# Patient Record
Sex: Male | Born: 1940 | Race: White | Hispanic: No | Marital: Married | Smoking: Never smoker
Health system: Southern US, Community
[De-identification: ages and names within clinical notes are randomized; demographics above are authoritative.]

## PROBLEM LIST (undated history)

## (undated) DIAGNOSIS — E785 Hyperlipidemia, unspecified: Secondary | ICD-10-CM

## (undated) DIAGNOSIS — N200 Calculus of kidney: Secondary | ICD-10-CM

## (undated) DIAGNOSIS — I10 Essential (primary) hypertension: Secondary | ICD-10-CM

## (undated) DIAGNOSIS — C801 Malignant (primary) neoplasm, unspecified: Secondary | ICD-10-CM

## (undated) DIAGNOSIS — N400 Enlarged prostate without lower urinary tract symptoms: Secondary | ICD-10-CM

## (undated) DIAGNOSIS — N2889 Other specified disorders of kidney and ureter: Secondary | ICD-10-CM

---

## 1999-12-21 HISTORY — PX: ROTATOR CUFF REPAIR: SHX139

## 2000-11-07 ENCOUNTER — Ambulatory Visit (HOSPITAL_BASED_OUTPATIENT_CLINIC_OR_DEPARTMENT_OTHER): Admission: RE | Admit: 2000-11-07 | Discharge: 2000-11-07 | Payer: Self-pay | Admitting: Orthopedic Surgery

## 2000-12-20 HISTORY — PX: KIDNEY SURGERY: SHX687

## 2013-07-20 DEATH — deceased

## 2013-11-01 ENCOUNTER — Other Ambulatory Visit: Payer: Self-pay | Admitting: Urology

## 2013-11-01 DIAGNOSIS — N2889 Other specified disorders of kidney and ureter: Secondary | ICD-10-CM

## 2013-11-14 ENCOUNTER — Ambulatory Visit
Admission: RE | Admit: 2013-11-14 | Discharge: 2013-11-14 | Disposition: A | Discharge status: Home / Self Care | Payer: Medicare Other | Source: Ambulatory Visit | Attending: Urology | Admitting: Urology

## 2013-11-14 DIAGNOSIS — N2889 Other specified disorders of kidney and ureter: Secondary | ICD-10-CM

## 2013-11-14 HISTORY — DX: Hyperlipidemia, unspecified: E78.5

## 2013-11-14 HISTORY — DX: Benign prostatic hyperplasia without lower urinary tract symptoms: N40.0

## 2013-11-14 HISTORY — DX: Essential (primary) hypertension: I10

## 2013-11-14 HISTORY — DX: Other specified disorders of kidney and ureter: N28.89

## 2013-11-14 HISTORY — DX: Calculus of kidney: N20.0

## 2013-11-19 ENCOUNTER — Telehealth: Payer: Self-pay | Admitting: Emergency Medicine

## 2013-11-19 NOTE — Telephone Encounter (Signed)
CALLED WILLIAM TO SET PT UP FOR RENAL CRYOABLATION W/DR [facility]. FAXED ALL INFO TO Chrissie Noa AT [phone] AUTHO STILL PENDING. TENT. DATE 12-06-13  THEY WILL CONTACT PT.

## 2013-11-22 ENCOUNTER — Telehealth: Payer: Self-pay | Admitting: Emergency Medicine

## 2013-11-22 NOTE — Telephone Encounter (Signed)
LM TO MAKE PT AWARE THAT INS. HAS BEEN APPROVED FOR THE RENAL CRYO AT [facility]. TENT. DATE IS 12-18--14 BUT THEY WILL CALL TO CONFIRM EVERYTHING.

## 2013-12-11 ENCOUNTER — Other Ambulatory Visit (HOSPITAL_COMMUNITY): Payer: Self-pay | Admitting: Interventional Radiology

## 2013-12-11 DIAGNOSIS — C641 Malignant neoplasm of right kidney, except renal pelvis: Secondary | ICD-10-CM

## 2013-12-18 ENCOUNTER — Other Ambulatory Visit: Payer: Self-pay | Admitting: Emergency Medicine

## 2013-12-18 ENCOUNTER — Other Ambulatory Visit (HOSPITAL_COMMUNITY): Payer: Self-pay | Admitting: Interventional Radiology

## 2013-12-18 DIAGNOSIS — C641 Malignant neoplasm of right kidney, except renal pelvis: Secondary | ICD-10-CM

## 2013-12-19 ENCOUNTER — Telehealth: Payer: Self-pay | Admitting: Emergency Medicine

## 2013-12-19 NOTE — Telephone Encounter (Signed)
LM FOR PT TO CALL BACK ABOUT SKIN IRRITATION ISSUES POST RENAL CRYOABLATION   S/W DR DDH TODAY AND HE SUGGESTED ICE THE AREA FOR 15 MIN.  Q HR., TAKE TYLENOL AS NEEDED AND TOPICAL OINTMENT WOULD BE FINE FOR " SKIN BURNING SENSATION"   2PM- PT CALLED BACK AND WILL TRY DR HASSELL'S SUGGESTIONS, IF NOT BETTER BY NEXT WEEK HE WILL CONTACT us BACK.

## 2014-01-17 ENCOUNTER — Ambulatory Visit (HOSPITAL_COMMUNITY)
Admission: RE | Admit: 2014-01-17 | Discharge: 2014-01-17 | Disposition: A | Discharge status: Home / Self Care | Payer: Medicare Other | Source: Ambulatory Visit | Attending: Interventional Radiology | Admitting: Interventional Radiology

## 2014-01-17 ENCOUNTER — Ambulatory Visit
Admission: RE | Admit: 2014-01-17 | Discharge: 2014-01-17 | Disposition: A | Discharge status: Home / Self Care | Payer: Medicare Other | Source: Ambulatory Visit | Attending: Interventional Radiology | Admitting: Interventional Radiology

## 2014-01-17 ENCOUNTER — Encounter (HOSPITAL_COMMUNITY): Payer: Self-pay

## 2014-01-17 VITALS — BP 138/85 | HR 81 | Temp 98.3°F | Resp 16 | Ht 72.0 in | Wt 190.0 lb

## 2014-01-17 DIAGNOSIS — C641 Malignant neoplasm of right kidney, except renal pelvis: Secondary | ICD-10-CM

## 2014-01-17 DIAGNOSIS — Z905 Acquired absence of kidney: Secondary | ICD-10-CM | POA: Insufficient documentation

## 2014-01-17 DIAGNOSIS — N289 Disorder of kidney and ureter, unspecified: Secondary | ICD-10-CM | POA: Insufficient documentation

## 2014-01-17 DIAGNOSIS — Z85528 Personal history of other malignant neoplasm of kidney: Secondary | ICD-10-CM | POA: Insufficient documentation

## 2014-01-17 MED ORDER — IOHEXOL 300 MG/ML  SOLN
100.0000 mL | Freq: Once | INTRAMUSCULAR | Status: AC | PRN
  Administered 2014-01-17: 100 mL via INTRAVENOUS

## 2014-05-09 ENCOUNTER — Other Ambulatory Visit (HOSPITAL_COMMUNITY): Payer: Self-pay | Admitting: Interventional Radiology

## 2014-05-09 ENCOUNTER — Other Ambulatory Visit: Payer: Self-pay | Admitting: Radiology

## 2014-05-09 DIAGNOSIS — N2889 Other specified disorders of kidney and ureter: Secondary | ICD-10-CM

## 2014-05-29 ENCOUNTER — Ambulatory Visit (HOSPITAL_COMMUNITY)
Admission: RE | Admit: 2014-05-29 | Discharge: 2014-05-29 | Disposition: A | Discharge status: Home / Self Care | Payer: Medicare Other | Source: Ambulatory Visit | Attending: Interventional Radiology | Admitting: Interventional Radiology

## 2014-05-29 ENCOUNTER — Encounter (HOSPITAL_COMMUNITY): Payer: Self-pay

## 2014-05-29 ENCOUNTER — Ambulatory Visit
Admission: RE | Admit: 2014-05-29 | Discharge: 2014-05-29 | Disposition: A | Discharge status: Home / Self Care | Payer: Medicare Other | Source: Ambulatory Visit | Attending: Interventional Radiology | Admitting: Interventional Radiology

## 2014-05-29 DIAGNOSIS — N200 Calculus of kidney: Secondary | ICD-10-CM | POA: Insufficient documentation

## 2014-05-29 DIAGNOSIS — N2889 Other specified disorders of kidney and ureter: Secondary | ICD-10-CM

## 2014-05-29 DIAGNOSIS — C649 Malignant neoplasm of unspecified kidney, except renal pelvis: Secondary | ICD-10-CM | POA: Insufficient documentation

## 2014-05-29 DIAGNOSIS — N289 Disorder of kidney and ureter, unspecified: Secondary | ICD-10-CM | POA: Insufficient documentation

## 2014-05-29 HISTORY — DX: Malignant (primary) neoplasm, unspecified: C80.1

## 2014-05-29 MED ORDER — IOHEXOL 300 MG/ML  SOLN
100.0000 mL | Freq: Once | INTRAMUSCULAR | Status: AC | PRN
  Administered 2014-05-29: 100 mL via INTRAVENOUS

## 2014-05-29 NOTE — Progress Notes (Signed)
Denies hematuria or any other problems with urination.

## 2014-08-01 ENCOUNTER — Other Ambulatory Visit: Payer: Self-pay | Admitting: Radiology

## 2014-08-01 ENCOUNTER — Other Ambulatory Visit (HOSPITAL_COMMUNITY): Payer: Self-pay | Admitting: Interventional Radiology

## 2014-08-01 DIAGNOSIS — N2889 Other specified disorders of kidney and ureter: Secondary | ICD-10-CM

## 2014-08-07 ENCOUNTER — Encounter: Payer: Self-pay | Admitting: Radiology

## 2014-09-11 ENCOUNTER — Ambulatory Visit (HOSPITAL_COMMUNITY)
Admission: RE | Admit: 2014-09-11 | Discharge: 2014-09-11 | Disposition: A | Discharge status: Home / Self Care | Payer: Medicare Other | Source: Ambulatory Visit | Attending: Interventional Radiology | Admitting: Interventional Radiology

## 2014-09-11 ENCOUNTER — Ambulatory Visit
Admission: RE | Admit: 2014-09-11 | Discharge: 2014-09-11 | Disposition: A | Discharge status: Home / Self Care | Payer: Medicare Other | Source: Ambulatory Visit | Attending: Interventional Radiology | Admitting: Interventional Radiology

## 2014-09-11 ENCOUNTER — Encounter (HOSPITAL_COMMUNITY): Payer: Self-pay

## 2014-09-11 DIAGNOSIS — N2889 Other specified disorders of kidney and ureter: Secondary | ICD-10-CM

## 2014-09-11 DIAGNOSIS — N289 Disorder of kidney and ureter, unspecified: Secondary | ICD-10-CM | POA: Insufficient documentation

## 2014-09-11 MED ORDER — IOHEXOL 300 MG/ML  SOLN
100.0000 mL | Freq: Once | INTRAMUSCULAR | Status: AC | PRN
  Administered 2014-09-11: 100 mL via INTRAVENOUS

## 2014-09-11 NOTE — Progress Notes (Signed)
Patient ID: Seth Manning, male   DOB: 10/26/1941, 73 y.o.   MRN: [id number]   Chief Complaint: 9 months status post cryoablation of a right renal cell carcinoma   Referring Physician(s): Cecilie Lowers, hall,  Urology, [facility] regional physicians   History of Present Illness: Seth Manning is a 73 y.o. male who underwent successful cryoablation of a right renal cell carcinoma, original size was a 3.1 by 2.7 cm. Cryoablation was performed 12/07/2003 at [facility]. He continues to do very well. No current abdominal pain, flank pain, dysuria, or hematuria. No fevers. Residual paresthesias and skin sensitivity in the right flank has resolved. Right shoulder pain continues to improve. No significant physical limitation. He remains very active and doing very well.  Past Medical History  Diagnosis Date  . Right renal mass   . Hypertension   . BPH (benign prostatic hypertrophy)   . Kidney calculus   . Hyperlipidemia   . Cancer     renal ca dx'd 2002/ rt. recurrence 09/2013    Past Surgical History  Procedure Laterality Date  . Rotator cuff repair Right 2001  . Kidney surgery Right 2002    Allergies: Review of patient's allergies indicates no known allergies.  Medications: Prior to Admission medications   Not on File    No family history on file.  History   Social History  . Marital Status: Married    Spouse Name: N/A    Number of Children: N/A  . Years of Education: N/A   Social History Main Topics  . Smoking status: Never Smoker   . Smokeless tobacco: Never Used  . Alcohol Use: No  . Drug Use: No  . Sexual Activity: Not on file   Other Topics Concern  . Not on file   Social History Narrative  . No narrative on file    Review of Systems: A 12 point ROS discussed and pertinent positives are indicated in the HPI above.  All other systems are negative.  Review of Systems  Constitutional: Negative for chills, diaphoresis, activity change and appetite change.    Respiratory: Negative for cough and chest tightness.   Cardiovascular: Negative for chest pain and palpitations.  Gastrointestinal: Negative for abdominal distention.  Genitourinary: Negative for dysuria, frequency, hematuria, flank pain and difficulty urinating.    Vital Signs: BP 147/79  Pulse 57  Temp(Src) 97.6 F (36.4 C) (Oral)  Resp 14  SpO2 96%  Physical Exam  Constitutional: He appears well-developed and well-nourished. No distress.  Abdominal: Soft. He exhibits no distension.  Negative for flank pain or CVA tenderness.  Skin: He is not diaphoretic.    Imaging: Ct Abd Wo & W Cm  09/11/2014   CLINICAL DATA:  Status post cryo ablation and partial right nephrectomy for renal cell carcinoma.  EXAM: CT ABDOMEN WITHOUT AND WITH CONTRAST  TECHNIQUE: Multidetector CT imaging of the abdomen was performed following the standard protocol before and following the bolus administration of intravenous contrast.  CONTRAST:  140mL OMNIPAQUE IOHEXOL 300 MG/ML  SOLN  COMPARISON:  05/29/2014  FINDINGS: Lower chest: The lung bases are clear. No pleural or pericardial effusion.  Hepatobiliary: No suspicious liver lesion identified. Collapsed gallbladder no biliary dilatation.  Spleen: Appears normal.  Pancreas: Appears normal.  Stomach/Bowel: The stomach is on unremarkable. The visualized portions of the small bowel appear normal. Multiple colonic diverticula identified.  Adrenals/urinary tract: Normal appearance of both adrenal glands. Cyst within the left kidney measures 2.1 x 1.4 cm, image 61/series 4. Postoperative  changes from inferior pole right nephrectomy and upper pole right cryoablation identified. Stable appearance of the cryo ablation zone measuring approximately 2 x 1.1 cm, image 50/series 4. The focus of enhancement ventral to the cryoablation zone is again noted. This measures 1.4 x 1.2 cm, image 50/series 2. Previously 1.5 x 1.2 cm. As mentioned previously there is an adjacent small artery  and this may represent a pseudo aneurysm. No additional enhancing renal lesions identified. Tiny stone is noted within the inferior pole of the left kidney, image 69/series 2.  Vascular/Lymphatic: Normal caliber of the abdominal aorta. No aneurysm. There is mild calcified atherosclerotic change noted. No enlarged retroperitoneal lymph nodes. No mesenteric adenopathy.  Musculoskeletal: No aggressive lytic or sclerotic bone lesions identified.  Other: None  IMPRESSION: 1. No acute findings within the abdomen or pelvis. 2. Stable cryo ablation zone involving the posterior medial aspect of the upper pole of right kidney. 3. Stable area of arterial in phase enhancement ventral to the cryo ablation zone which may reflect avascular lesion. 4. No evidence of metastatic disease or new suspicious renal lesion.   Electronically Signed   By: Kerby Moors M.D.   On: 09/11/2014 11:03    Labs: Outside labs demonstrate a stable creatinine measuring 1.33. GFR 53.   Assessment and Plan: status post cryoablation of a 3.1 cm right upper pole renal cell carcinoma. Surveillance CT imaging confirms a stable 14 x 12 mm hyperenhancing focus along the medial cryoablation zone border compatible with residual tumor. No significant change over 3 months. We had a lengthy discussion today about proceeding with repeat cryoablation and also protecting the upper pole collecting system with a ureteral stent prior to a second procedure. Another option discussed was to continue close CT surveillance. At this time, because he is asymptomatic, he would like to continue with close CT surveillance. Repeat multiphase abdomen CT will be performed at 6 months.  The patient and his wife are in agreement with this plan.  **   I spent a total of 30 minutes face to face in clinical consultation, greater than 50% of which was counseling/coordinating care for his right renal cell carcinoma  Signed: Briley Bumgarner T. 09/11/2014, 2:01 PM

## 2015-02-04 ENCOUNTER — Telehealth: Payer: Self-pay | Admitting: Radiology

## 2015-02-04 NOTE — Telephone Encounter (Signed)
Recent CT Abd w/ & w/o contrast of 12/30/2014 (ordered by Dr Jonette Eva & performed at [facility], [place]).  Images & results reviewed by Dr Daryll Brod.  Patient phoned.  Our office does not need to repeat CT at this time.  Patient stated that Dr [place] Crane to order next CT follow up in December 2016.   Will check w/ Dr Annamaria Boots re:  ? Additional follow up here.  Natalynn Pedone Riki Rusk, RN 02/04/2015 12:29 PM

## 2015-11-11 ENCOUNTER — Other Ambulatory Visit (HOSPITAL_COMMUNITY): Payer: Self-pay | Admitting: Interventional Radiology

## 2015-11-11 DIAGNOSIS — C641 Malignant neoplasm of right kidney, except renal pelvis: Secondary | ICD-10-CM

## 2015-11-12 ENCOUNTER — Encounter: Payer: Self-pay | Admitting: Radiology

## 2015-11-12 ENCOUNTER — Other Ambulatory Visit: Payer: Self-pay | Admitting: Radiology

## 2015-11-12 DIAGNOSIS — C641 Malignant neoplasm of right kidney, except renal pelvis: Secondary | ICD-10-CM

## 2015-12-03 ENCOUNTER — Ambulatory Visit
Admission: RE | Admit: 2015-12-03 | Discharge: 2015-12-03 | Disposition: A | Discharge status: Home / Self Care | Payer: Medicare Other | Source: Ambulatory Visit | Attending: Interventional Radiology | Admitting: Interventional Radiology

## 2015-12-03 ENCOUNTER — Ambulatory Visit (HOSPITAL_COMMUNITY)
Admission: RE | Admit: 2015-12-03 | Discharge: 2015-12-03 | Disposition: A | Discharge status: Home / Self Care | Payer: Medicare Other | Source: Ambulatory Visit | Attending: Interventional Radiology | Admitting: Interventional Radiology

## 2015-12-03 DIAGNOSIS — Z85528 Personal history of other malignant neoplasm of kidney: Secondary | ICD-10-CM | POA: Diagnosis present

## 2015-12-03 DIAGNOSIS — C641 Malignant neoplasm of right kidney, except renal pelvis: Secondary | ICD-10-CM

## 2015-12-03 DIAGNOSIS — Z9049 Acquired absence of other specified parts of digestive tract: Secondary | ICD-10-CM | POA: Diagnosis not present

## 2015-12-03 DIAGNOSIS — K449 Diaphragmatic hernia without obstruction or gangrene: Secondary | ICD-10-CM | POA: Diagnosis not present

## 2015-12-03 DIAGNOSIS — Z905 Acquired absence of kidney: Secondary | ICD-10-CM | POA: Insufficient documentation

## 2015-12-03 DIAGNOSIS — I2510 Atherosclerotic heart disease of native coronary artery without angina pectoris: Secondary | ICD-10-CM | POA: Diagnosis not present

## 2015-12-03 DIAGNOSIS — C649 Malignant neoplasm of unspecified kidney, except renal pelvis: Secondary | ICD-10-CM | POA: Insufficient documentation

## 2015-12-03 MED ORDER — IOHEXOL 300 MG/ML  SOLN
100.0000 mL | Freq: Once | INTRAMUSCULAR | Status: AC | PRN
  Administered 2015-12-03: 100 mL via INTRAVENOUS

## 2015-12-03 NOTE — Progress Notes (Signed)
Patient ID: Seth Manning, male   DOB: [birth year], 74 y.o.   MRN: [id number]       Chief Complaint: Patient was seen in consultation today for  Chief Complaint  Patient presents with  . Follow-up    2 yr follow up Cryoablation of RIght Renal Mass     at the request of Esraa Seres  Referring Physician(s): Nevada Crane  History of Present Illness: Seth Manning is a 74 y.o. male who underwent successful cryoablation of a right renal cell carcinoma December 2014. Original lesion size was 3.1 x 2.7 cm. He continues to do very well. No current abdominal pain, flank pain, dysuria or hematuria. No fevers. Right flank paresthesias have resolved. No current physical limitations. Functional status is excellent. He returns for outpatient follow-up at 6 months with a repeat surveillance CT.  Past Medical History  Diagnosis Date  . Right renal mass   . Hypertension   . BPH (benign prostatic hypertrophy)   . Kidney calculus   . Hyperlipidemia   . Cancer     renal ca dx'd 2002/ rt. recurrence 09/2013    Past Surgical History  Procedure Laterality Date  . Rotator cuff repair Right 2001  . Kidney surgery Right 2002    Allergies: Review of patient's allergies indicates no known allergies.  Medications: Prior to Admission medications   Medication Sig Start Date End Date Taking? Authorizing Provider  atenolol (TENORMIN) 25 MG tablet Take 12.5 mg by mouth daily.   Yes Historical Provider, MD  finasteride (PROSCAR) 5 MG tablet Take 5 mg by mouth daily.   Yes Historical Provider, MD  sertraline (ZOLOFT) 50 MG tablet Take 50 mg by mouth daily.   Yes Historical Provider, MD  simvastatin (ZOCOR) 40 MG tablet Take 40 mg by mouth daily.   Yes Historical Provider, MD     No family history on file.  Social History   Social History  . Marital Status: Married    Spouse Name: N/A  . Number of Children: N/A  . Years of Education: N/A   Social History Main Topics  . Smoking status: Never Smoker   .  Smokeless tobacco: Never Used  . Alcohol Use: No  . Drug Use: No  . Sexual Activity: Not on file   Other Topics Concern  . Not on file   Social History Narrative  . No narrative on file     Review of Systems: A 12 point ROS discussed and pertinent positives are indicated in the HPI above.  All other systems are negative.  Review of Systems  Vital Signs: BP 120/77 mmHg  Pulse 75  Temp(Src) 97.5 F (36.4 C) (Oral)  Resp 14  Ht 6' (1.829 m)  Wt 185 lb (83.915 kg)  BMI 25.08 kg/m2  SpO2 98%  Physical Exam  Constitutional: He appears well-developed and well-nourished. No distress.  Neck: Neck supple.  No carotid bruit.  Cardiovascular: Normal rate and regular rhythm.  Exam reveals no friction rub.   No murmur heard. Pulmonary/Chest: Effort normal and breath sounds normal. No respiratory distress. He has no wheezes.  Abdominal: Soft. Bowel sounds are normal. He exhibits no distension and no mass. There is guarding. No hernia.  Skin: Skin is warm and dry. No rash noted. He is not diaphoretic. No erythema.  Psychiatric: He has a normal mood and affect. His behavior is normal. Thought content normal.     Imaging: Ct Abd Wo & W Cm  12/03/2015  CLINICAL DATA:  Two years post right  renal cryoablation, previous right partial nephrectomy. Right renal cell carcinoma. EXAM: CT ABDOMEN WITHOUT AND WITH CONTRAST TECHNIQUE: Multidetector CT imaging of the abdomen was performed following the standard protocol before and following the bolus administration of intravenous contrast. CONTRAST:  170mL OMNIPAQUE IOHEXOL 300 MG/ML  SOLN COMPARISON:  12/30/2014. FINDINGS: Lower chest: 4 mm medial left lower lobe nodule (image 6), unchanged from 10/31/2013. Lung bases are otherwise clear. Heart size normal. No pericardial or pleural effusion. Hepatobiliary: Liver is unremarkable. Cholecystectomy. No biliary ductal dilatation. Pancreas: Negative. Spleen: Negative. Adrenals/Urinary Tract: Adrenal glands  are unremarkable. There may be punctate renal stones bilaterally. Cryoablation defect is seen along the medial aspect of the upper pole right kidney. Rounded enhancement along the medial portion of the ablation defect is again seen, measuring 1.2 x 1.4 cm, stable to minimally more prominent than on 12/30/2014, at which time it measured approximately 1.2 x 1.2 cm when remeasured. Scarring along the lower pole right kidney. Low-attenuation lesions in the left kidney measure up to 1.8 cm, consistent with cysts. Stomach/Bowel: Tiny hiatal hernia. Stomach is otherwise unremarkable. Duodenal diverticulum is incidentally noted. Visualized portions of the small bowel and colon are otherwise unremarkable. Vascular/Lymphatic: Atherosclerotic calcification of the arterial vasculature without abdominal aortic aneurysm. No pathologically enlarged lymph nodes. Other: No free fluid.  Mesenteries and peritoneum are unremarkable. Musculoskeletal: No worrisome lytic or sclerotic lesions. Degenerative changes are seen in the spine. IMPRESSION: 1. Cryoablation defect in the upper pole right kidney with a small area of nodular enhancement along the medial margin, stable to minimally enlarged from 12/30/2014. 2. Question punctate stones in the kidneys bilaterally. Electronically Signed   By: Lorin Picket M.D.   On: 12/03/2015 12:25    Labs:  Creatinine 1.4 11/28/2015 (previous creatinine 1 year ago 1.33)  Assessment and Plan:  2 years status post right renal cell carcinoma cryoablation. Original lesion measured 3.1 cm in diameter in the right kidney upper pole. Surveillance CT imaging confirms a persistent stable 14 x 12 mm enhancing focus along the medial ablation zone compatible with residual tumor. No interval change dating back to September 2015. We had a lengthy discussion regarding repeat cryoablation versus continued close CT surveillance. At this time given that he continues to be asymptomatic he would like to  continue with 6 month close CT surveillance. He is a very compliant patient, and if there is any significant change on the next CT he will consider repeat cryoablation.  Plan: Repeat multiphase abdomen CT in 6 months with outpatient follow-up.  Thank you for this interesting consult.  I greatly enjoyed meeting Seth Manning and look forward to participating in their care.  A copy of this report was sent to the requesting provider on this date.  SignedGreggory Keen 12/03/2015, 2:18 PM   I spent a total of    25 Minutes in face to face in clinical consultation, greater than 50% of which was counseling/coordinating care for this patient with a right renal cell carcinoma, status post cryoablation.

## 2016-05-04 ENCOUNTER — Other Ambulatory Visit (HOSPITAL_COMMUNITY): Payer: Self-pay | Admitting: Interventional Radiology

## 2016-05-04 ENCOUNTER — Other Ambulatory Visit: Payer: Self-pay | Admitting: Radiology

## 2016-05-04 DIAGNOSIS — C641 Malignant neoplasm of right kidney, except renal pelvis: Secondary | ICD-10-CM

## 2016-05-18 ENCOUNTER — Encounter: Payer: Self-pay | Admitting: Radiology

## 2016-06-10 ENCOUNTER — Ambulatory Visit
Admission: RE | Admit: 2016-06-10 | Discharge: 2016-06-10 | Disposition: A | Discharge status: Home / Self Care | Payer: Medicare Other | Source: Ambulatory Visit | Attending: Interventional Radiology | Admitting: Interventional Radiology

## 2016-06-10 DIAGNOSIS — C641 Malignant neoplasm of right kidney, except renal pelvis: Secondary | ICD-10-CM

## 2016-06-10 NOTE — Progress Notes (Signed)
Patient ID: Seth Manning, male   DOB: Apr 24, 1941, 75 y.o.   MRN: [id number]    Chief Complaint: Right renal cell carcinoma, localized recurrence at the ablation defect. 2.5 year follow-up. Referring Physician(s): hall  History of Present Illness: Seth Manning is a 75 y.o. male with a known right renal cell carcinoma. He underwent successful cryoablation December 2014. Original lesion size 3.1 x 2.7 cm. Close surveillance imaging demonstrates a localized recurrence along the medial ablation zone, slightly enlarged compared to 6 months ago measuring 17 x 15 mm. He remains asymptomatically. No current abdominal pain, flank pain, dysuria or hematuria. No fevers. No current physical limitations. Excellent functional status. He returns for outpatient follow-up and review of his surveillance imaging.  Past Medical History  Diagnosis Date  . Right renal mass   . Hypertension   . BPH (benign prostatic hypertrophy)   . Kidney calculus   . Hyperlipidemia   . Cancer     renal ca dx'd 2002/ rt. recurrence 09/2013    Past Surgical History  Procedure Laterality Date  . Rotator cuff repair Right 2001  . Kidney surgery Right 2002    Allergies: Review of patient's allergies indicates no known allergies.  Medications: Prior to Admission medications   Medication Sig Start Date End Date Taking? Authorizing Provider  atenolol (TENORMIN) 25 MG tablet Take 12.5 mg by mouth daily.   Yes Historical Provider, MD  finasteride (PROSCAR) 5 MG tablet Take 5 mg by mouth daily.   Yes Historical Provider, MD  sertraline (ZOLOFT) 50 MG tablet Take 50 mg by mouth daily.   Yes Historical Provider, MD  simvastatin (ZOCOR) 40 MG tablet Take 40 mg by mouth daily.   Yes Historical Provider, MD     No family history on file.  Social History   Social History  . Marital Status: Married    Spouse Name: N/A  . Number of Children: N/A  . Years of Education: N/A   Social History Main Topics  . Smoking status:  Never Smoker   . Smokeless tobacco: Never Used  . Alcohol Use: No  . Drug Use: No  . Sexual Activity: Not on file   Other Topics Concern  . Not on file   Social History Narrative  . No narrative on file    ECOG Status: 0 - Asymptomatic  Review of Systems: A 12 point ROS discussed and pertinent positives are indicated in the HPI above.  All other systems are negative.  Review of Systems  Vital Signs: BP 132/79 mmHg  Pulse 66  Temp(Src) 97.5 F (36.4 C) (Oral)  Resp 14  Ht 5\' 10"  (1.778 m)  Wt 185 lb (83.915 kg)  BMI 26.54 kg/m2  SpO2 98%  Physical Exam  Constitutional: He appears well-developed and well-nourished. No distress.  Cardiovascular: Normal rate and regular rhythm.  Exam reveals no friction rub.   No murmur heard. Pulmonary/Chest: Effort normal and breath sounds normal. No respiratory distress.  Abdominal: Soft. Bowel sounds are normal. He exhibits no distension and no mass. No hernia.  Skin: Skin is warm and dry. No rash noted. He is not diaphoretic. No erythema.  Psychiatric: He has a normal mood and affect. His behavior is normal.    Imaging: Surveillance CT imaging performed at [facility] physicians urology office 05/24/2016. This demonstrates a 17 x 15 mm medial right upper pole cryoablation defect recurrence with interval enlargement. This would be amenable to repeat cryoablation.  Assessment and Plan:  75 year old male 2.5 years status  post right renal cell carcinoma cryoablation. Original lesion measures 3.1 cm. Surveillance CT imaging demonstrates slight enlargement of an enhancing focus along the medial ablation zone compatible with residual or recurrent tumor. This recent exam, the localized recurrence measures 17 x 15 mm. This is slightly larger than December 2016. Treatment options were reviewed including repeat image guided ablation. The procedure, risks, benefits and alternatives were discussed. All questions were addressed. Since there is  interval enlargement, he would like to proceed with treatment again. This will be scheduled in July at [facility].  Plan: Schedule for repeat right renal cell carcinoma cryoablation for localized recurrence at the ablation defect at [facility] hospital.   Electronically Signed: Greggory Keen 06/10/2016, 2:52 PM   I spent a total of    25 Minutes in face to face in clinical consultation, greater than 50% of which was counseling/coordinating care for this patient with a right renal cell carcinoma.

## 2016-07-16 ENCOUNTER — Other Ambulatory Visit: Payer: Self-pay | Admitting: Physician Assistant

## 2016-07-16 DIAGNOSIS — C641 Malignant neoplasm of right kidney, except renal pelvis: Secondary | ICD-10-CM

## 2016-08-18 ENCOUNTER — Ambulatory Visit
Admission: RE | Admit: 2016-08-18 | Discharge: 2016-08-18 | Disposition: A | Discharge status: Home / Self Care | Payer: Medicare Other | Source: Ambulatory Visit | Attending: Physician Assistant | Admitting: Physician Assistant

## 2016-08-18 DIAGNOSIS — C641 Malignant neoplasm of right kidney, except renal pelvis: Secondary | ICD-10-CM

## 2016-08-18 HISTORY — PX: IR GENERIC HISTORICAL: IMG1180011

## 2016-08-18 NOTE — Progress Notes (Signed)
Patient ID: Seth Manning, male   DOB: [birth year], 75 y.o.   MRN: [id number]   Referring Physician(s): Jonette Eva, Urology [facility] system  Chief Complaint: The patient is seen in follow up today s/p Cryoablation of a right recurrent renal cell carcinoma  History of present illness:  This is a 75 yo male who presents today 1 month s/p the above procedure.  He is doing very well today.  He has no complaints.  He had some hematuria immediately post op, but this cleared up prior to discharge.  He has had no more since then.  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  He denies any pain, fevers, or other complaints.  Past Medical History:  Diagnosis Date  . BPH (benign prostatic hypertrophy)   . Cancer    renal ca dx'd 2002/ rt. recurrence 09/2013  . Hyperlipidemia   . Hypertension   . Kidney calculus   . Right renal mass     Past Surgical History:  Procedure Laterality Date  . KIDNEY SURGERY Right 2002  . ROTATOR CUFF REPAIR Right 2001    Allergies: Review of patient's allergies indicates no known allergies.  Medications: Prior to Admission medications   Medication Sig Start Date End Date Taking? Authorizing Provider  atenolol (TENORMIN) 25 MG tablet Take 12.5 mg by mouth daily.    Historical Provider, MD  finasteride (PROSCAR) 5 MG tablet Take 5 mg by mouth daily.    Historical Provider, MD  sertraline (ZOLOFT) 50 MG tablet Take 50 mg by mouth daily.    Historical Provider, MD  simvastatin (ZOCOR) 40 MG tablet Take 40 mg by mouth daily.    Historical Provider, MD     No family history on file.  Social History   Social History  . Marital status: Married    Spouse name: N/A  . Number of children: N/A  . Years of education: N/A   Social History Main Topics  . Smoking status: Never Smoker  . Smokeless tobacco: Never Used  . Alcohol use No  . Drug use: No  . Sexual activity: Not on file   Other Topics Concern  . Not  on file   Social History Narrative  . No narrative on file     Vital Signs: BP (!) 164/84 (BP Location: Left Arm, Patient Position: Sitting, Cuff Size: Normal)   Pulse (!) 53   Temp 97.8 F (36.6 C) (Oral)   Resp 14   SpO2 98%   Physical Exam  Gen: NAD Heart: regular Lungs :CTAB Abd: soft, NT, ND, +BS, no CVA tenderness  Imaging: No results found.  Labs:  CBC: No results for input(s): WBC, HGB, HCT, PLT in the last 8760 hours.  COAGS: No results for input(s): INR, APTT in the last 8760 hours.  BMP: No results for input(s): NA, K, CL, CO2, GLUCOSE, BUN, CALCIUM, CREATININE, GFRNONAA, GFRAA in the last 8760 hours.  Invalid input(s): CMP  LIVER FUNCTION TESTS: No results for input(s): BILITOT, AST, ALT, ALKPHOS, PROT, ALBUMIN in the last 8760 hours.  Assessment:  1. S/p cryoablation of recurrent right renal cell carcinoma on 07-15-16 by Dr. Annamaria Boots.  The patient is doing very well.  He has no complaints.  We will plan for a repeat CT of the Abdomen with and without contrast in 3 months from now with a follow up appointment with Dr. Annamaria Boots to discuss these results and see how he is doing.  The  patient understands this and has no questions at this time.  Signed: Henreitta Cea 08/18/2016, 12:24 PM   Please refer to Dr. Fritz Pickerel attestation of this note for management and plan.

## 2016-12-23 ENCOUNTER — Encounter: Payer: Self-pay | Admitting: Interventional Radiology

## 2017-01-26 ENCOUNTER — Other Ambulatory Visit (HOSPITAL_COMMUNITY): Payer: Self-pay | Admitting: Interventional Radiology

## 2017-01-26 DIAGNOSIS — C641 Malignant neoplasm of right kidney, except renal pelvis: Secondary | ICD-10-CM

## 2017-02-17 ENCOUNTER — Other Ambulatory Visit: Payer: Medicare Other

## 2018-02-01 ENCOUNTER — Other Ambulatory Visit: Payer: Self-pay | Admitting: Radiology

## 2018-02-01 ENCOUNTER — Other Ambulatory Visit (HOSPITAL_COMMUNITY): Payer: Self-pay | Admitting: Interventional Radiology

## 2018-02-01 DIAGNOSIS — N2889 Other specified disorders of kidney and ureter: Secondary | ICD-10-CM

## 2018-02-08 ENCOUNTER — Other Ambulatory Visit: Payer: Self-pay | Admitting: Radiology

## 2018-02-08 DIAGNOSIS — N2889 Other specified disorders of kidney and ureter: Secondary | ICD-10-CM

## 2018-03-02 ENCOUNTER — Encounter: Payer: Self-pay | Admitting: Radiology

## 2018-03-02 ENCOUNTER — Ambulatory Visit
Admission: RE | Admit: 2018-03-02 | Discharge: 2018-03-02 | Disposition: A | Discharge status: Home / Self Care | Payer: Medicare Other | Source: Ambulatory Visit | Attending: Interventional Radiology | Admitting: Interventional Radiology

## 2018-03-02 ENCOUNTER — Ambulatory Visit (HOSPITAL_COMMUNITY)
Admission: RE | Admit: 2018-03-02 | Discharge: 2018-03-02 | Disposition: A | Discharge status: Home / Self Care | Payer: Medicare Other | Source: Ambulatory Visit | Attending: Interventional Radiology | Admitting: Interventional Radiology

## 2018-03-02 DIAGNOSIS — N2889 Other specified disorders of kidney and ureter: Secondary | ICD-10-CM | POA: Diagnosis not present

## 2018-03-02 DIAGNOSIS — N200 Calculus of kidney: Secondary | ICD-10-CM | POA: Insufficient documentation

## 2018-03-02 DIAGNOSIS — Z9889 Other specified postprocedural states: Secondary | ICD-10-CM | POA: Insufficient documentation

## 2018-03-02 DIAGNOSIS — I700 Atherosclerosis of aorta: Secondary | ICD-10-CM | POA: Diagnosis not present

## 2018-03-02 DIAGNOSIS — K5730 Diverticulosis of large intestine without perforation or abscess without bleeding: Secondary | ICD-10-CM | POA: Diagnosis not present

## 2018-03-02 DIAGNOSIS — K8020 Calculus of gallbladder without cholecystitis without obstruction: Secondary | ICD-10-CM | POA: Diagnosis not present

## 2018-03-02 HISTORY — PX: IR RADIOLOGIST EVAL & MGMT: IMG5224

## 2018-03-02 LAB — POCT I-STAT CREATININE: Creatinine, Ser: 1.3 mg/dL — ABNORMAL HIGH (ref 0.61–1.24)

## 2018-03-02 MED ORDER — IOPAMIDOL (ISOVUE-370) INJECTION 76%
100.0000 mL | Freq: Once | INTRAVENOUS | Status: AC | PRN
  Administered 2018-03-02: 100 mL via INTRAVENOUS

## 2018-03-02 MED ORDER — IOPAMIDOL (ISOVUE-370) INJECTION 76%
INTRAVENOUS | Status: AC
  Filled 2018-03-02: qty 100

## 2018-03-02 NOTE — Progress Notes (Signed)
Patient ID: Seth Manning, male   DOB: November 13, 1941, 77 y.o.   MRN: [id number]       Chief Complaint: 20 months status post right renal cell carcinoma cryoablation.   Referring Physician(s): Nevada Crane  History of Present Illness: Seth Manning is a 77 y.o. male with a known right renal cell carcinoma. He underwent successful repeat cryoablation of a local recurrence at a previous right renal ablation site. He is now 20 months status post his second ablation. He returns for outpatient imaging and surveillance. He remains asymptomatic. No current abdominal pain, flank pain, dysuria hematuria. No fevers. Stable weight and appetite. No physical limitations. Excellent functional status. CT today is stable demonstrate no evidence of local recurrence or residual tumor.  Past Medical History:  Diagnosis Date  . BPH (benign prostatic hypertrophy)   . Cancer ([place])    renal ca dx'd 2002/ rt. recurrence 09/2013  . Hyperlipidemia   . Hypertension   . Kidney calculus   . Right renal mass      Allergies: Patient has no known allergies.  Medications: Prior to Admission medications   Medication Sig Start Date End Date Taking? Authorizing Provider  atenolol (TENORMIN) 25 MG tablet Take 12.5 mg by mouth daily.    [provider]  finasteride (PROSCAR) 5 MG tablet Take 5 mg by mouth daily.    [provider]  sertraline (ZOLOFT) 50 MG tablet Take 50 mg by mouth daily.    [provider]  simvastatin (ZOCOR) 40 MG tablet Take 40 mg by mouth daily.    [provider]     No family history on file.  Social History   Socioeconomic History  . Marital status: Married    Spouse name: Not on file  . Number of children: Not on file  . Years of education: Not on file  . Highest education level: Not on file  Social Needs  . Financial resource strain: Not on file  . Food insecurity - worry: Not on file  . Food insecurity - inability: Not on file  . Transportation needs -  medical: Not on file  . Transportation needs - non-medical: Not on file  Occupational History  . Not on file  Tobacco Use  . Smoking status: Never Smoker  . Smokeless tobacco: Never Used  Substance and Sexual Activity  . Alcohol use: No  . Drug use: No  . Sexual activity: Not on file  Other Topics Concern  . Not on file  Social History Narrative  . Not on file    Review of Systems: A 12 point ROS discussed and pertinent positives are indicated in the HPI above.  All other systems are negative.  Review of Systems  Vital Signs: BP (!) 149/86   Pulse 60   Temp 97.7 F (36.5 C) (Oral)   Resp 14   Ht 6' (1.829 m)   Wt 180 lb (81.6 kg)   SpO2 97%   BMI 24.41 kg/m   Physical Exam  Constitutional: He appears well-developed and well-nourished. No distress.  Eyes: Conjunctivae are normal.  Cardiovascular: Normal rate, regular rhythm and normal heart sounds.  No murmur heard. Pulmonary/Chest: Effort normal and breath sounds normal. No respiratory distress.  Abdominal: Soft. Bowel sounds are normal. He exhibits no distension. There is no tenderness.  Musculoskeletal: Normal range of motion. He exhibits no edema.  Neurological: He is alert.  Skin: Skin is warm and dry. He is not diaphoretic.  Psychiatric: He has a normal mood and  affect.     Imaging: Ct Abdomen W Wo Contrast  Result Date: 03/02/2018 CLINICAL DATA:  Status post partial right nephrectomy 2002 for renal cell carcinoma. Status post CT-guided cryoablation 12/06/2013 and 07/15/2016 for recurrent tumor at the partial nephrectomy site. Patient presents for routine surveillance. EXAM: CT ABDOMEN WITHOUT AND WITH CONTRAST TECHNIQUE: Multidetector CT imaging of the abdomen was performed following the standard protocol before and following the bolus administration of intravenous contrast. CONTRAST:  130mL ISOVUE-370 IOPAMIDOL (ISOVUE-370) INJECTION 76% COMPARISON:  02/17/2017 CT abdomen. FINDINGS: Lower chest: No significant  pulmonary nodules or acute consolidative airspace disease. Hepatobiliary: Normal liver with no liver mass. Contracted gallbladder contains small calcified gallstone, with no gallbladder wall thickening or pericholecystic fluid. No biliary ductal dilatation. Stable moderate periampullary duodenal diverticulum. Pancreas: Normal, with no mass or duct dilation. Spleen: Normal size. No mass. Adrenals/Urinary Tract: Nonobstructing 4 mm upper right renal stone. Nonobstructing lower right renal stones, largest 4 mm. No left renal stones. No hydronephrosis. Stable 2.3 x 1.9 cm ablation defect in medial upper right kidney with mild precontrast hyperdensity and no convincing enhancement. Stable mild scarring in the lower right kidney. Simple 2.2 cm anterior interpolar left renal cyst. Small stable parapelvic renal cysts in the left kidney. No new renal lesions. Stomach/Bowel: Normal non-distended stomach. Visualized small and large bowel is normal caliber, with no bowel wall thickening. Marked colonic diverticulosis. Vascular/Lymphatic: Atherosclerotic nonaneurysmal abdominal aorta. Patent portal, splenic, hepatic and renal veins. No pathologically enlarged lymph nodes in the abdomen. Other: No pneumoperitoneum, ascites or focal fluid collection. Stable mild diastasis of the lateral right abdominal muscle wall. Musculoskeletal: No aggressive appearing focal osseous lesions. Marked thoracolumbar spondylosis. IMPRESSION: 1. Stable post ablation change in the upper right kidney with no evidence of local tumor recurrence. 2. No evidence of metastatic disease in the abdomen. 3. Chronic findings include: Aortic Atherosclerosis (ICD10-I70.0). Cholelithiasis. Nonobstructing right nephrolithiasis. Marked colonic diverticulosis. Electronically Signed   By: Ilona Sorrel M.D.   On: 03/02/2018 15:13   [facility] Radiologist Eval & Mgmt  Result Date: 03/02/2018 Please refer to notes tab for details about interventional procedure. (Op  Note)   Labs:  CBC: No results for input(s): WBC, HGB, HCT, PLT in the last 8760 hours.  COAGS: No results for input(s): INR, APTT in the last 8760 hours.  BMP: Recent Labs    03/02/18 1207  CREATININE 1.30*    LIVER FUNCTION TESTS: No results for input(s): BILITOT, AST, ALT, ALKPHOS, PROT, ALBUMIN in the last 8760 hours.   Assessment and Plan:  20 months status post repeat right renal cryoablation for local recurrence had a previous ablation site. CT imaging today demonstrates no evidence of residual or recurrent tumor at the treated site. No delay complication. Overall he is doing very well.  Plan: Continue annual surveillance in March 2020 with a repeat CT.    Electronically Signed: Greggory Keen 03/02/2018, 3:19 PM   I spent a total of    25 Minutes in face to face in clinical consultation, greater than 50% of which was counseling/coordinating care for this patient status post right renal cryoablation.

## 2018-04-04 ENCOUNTER — Other Ambulatory Visit (HOSPITAL_COMMUNITY): Payer: Medicare Other

## 2019-02-15 ENCOUNTER — Other Ambulatory Visit: Payer: Self-pay | Admitting: Interventional Radiology

## 2019-02-15 DIAGNOSIS — N2889 Other specified disorders of kidney and ureter: Secondary | ICD-10-CM

## 2019-03-06 ENCOUNTER — Other Ambulatory Visit: Payer: Self-pay | Admitting: *Deleted

## 2019-03-06 DIAGNOSIS — N2889 Other specified disorders of kidney and ureter: Secondary | ICD-10-CM

## 2019-03-20 ENCOUNTER — Other Ambulatory Visit: Payer: Medicare Other

## 2019-03-20 ENCOUNTER — Other Ambulatory Visit (HOSPITAL_COMMUNITY): Payer: Medicare Other

## 2019-03-29 ENCOUNTER — Other Ambulatory Visit: Payer: Medicare Other

## 2019-03-29 ENCOUNTER — Other Ambulatory Visit (HOSPITAL_COMMUNITY): Payer: Medicare Other

## 2019-07-13 ENCOUNTER — Other Ambulatory Visit: Payer: Self-pay | Admitting: *Deleted

## 2019-07-13 DIAGNOSIS — N2889 Other specified disorders of kidney and ureter: Secondary | ICD-10-CM

## 2019-07-24 ENCOUNTER — Other Ambulatory Visit: Payer: Self-pay | Admitting: Interventional Radiology

## 2019-07-24 ENCOUNTER — Encounter (HOSPITAL_COMMUNITY): Payer: Self-pay

## 2019-07-24 ENCOUNTER — Other Ambulatory Visit: Payer: Self-pay

## 2019-07-24 ENCOUNTER — Ambulatory Visit (HOSPITAL_COMMUNITY)
Admission: RE | Admit: 2019-07-24 | Discharge: 2019-07-24 | Disposition: A | Discharge status: Home / Self Care | Payer: Medicare Other | Source: Ambulatory Visit | Attending: Interventional Radiology | Admitting: Interventional Radiology

## 2019-07-24 DIAGNOSIS — N2889 Other specified disorders of kidney and ureter: Secondary | ICD-10-CM

## 2019-07-24 LAB — POCT I-STAT CREATININE: Creatinine, Ser: 1.4 mg/dL — ABNORMAL HIGH (ref 0.61–1.24)

## 2019-07-24 MED ORDER — IOHEXOL 300 MG/ML  SOLN
100.0000 mL | Freq: Once | INTRAMUSCULAR | Status: AC | PRN
  Administered 2019-07-24: 11:00:00 100 mL via INTRAVENOUS

## 2019-07-24 MED ORDER — SODIUM CHLORIDE (PF) 0.9 % IJ SOLN
INTRAMUSCULAR | Status: AC
  Filled 2019-07-24: qty 50

## 2019-07-26 ENCOUNTER — Ambulatory Visit
Admission: RE | Admit: 2019-07-26 | Discharge: 2019-07-26 | Disposition: A | Discharge status: Home / Self Care | Payer: Medicare Other | Source: Ambulatory Visit | Attending: Interventional Radiology | Admitting: Interventional Radiology

## 2019-07-26 ENCOUNTER — Encounter: Payer: Self-pay | Admitting: *Deleted

## 2019-07-26 DIAGNOSIS — N2889 Other specified disorders of kidney and ureter: Secondary | ICD-10-CM

## 2019-07-26 HISTORY — PX: IR RADIOLOGIST EVAL & MGMT: IMG5224

## 2019-07-26 NOTE — Progress Notes (Signed)
Patient ID: Seth Manning, male   DOB: February 14, 1941, 78 y.o.   MRN: [id number]       Chief Complaint: Patient was consulted remotely today ([facility]) for follow-up of renal cell carcinoma cryoablation at the request of Dr. Nevada Crane  Referring Physician(s):  Dr. Nevada Crane  History of Present Illness: Seth Manning is a 78 y.o. male with a previous right renal cell carcinoma.  He underwent successful repeat cryoablation of a local recurrence at the previous right ablation site.  He is now approximately 3 years status post the repeat ablation.  In the interval, he has had surveillance imaging.  He remains asymptomatic.  No significant flank abdominal pain.  No dysuria hematuria.  No interval of illness or fevers.  Stable weight and appetite.  No physical limitations.  Excellent functional status.  CT performed 07/24/2019 demonstrates a stable ablation defect.  No local recurrence or residual tumor.  No new renal abnormality or acute finding.  Past Medical History:  Diagnosis Date   BPH (benign prostatic hypertrophy)    Cancer ([place])    renal ca dx'd 2002/ rt. recurrence 09/2013   Hyperlipidemia    Hypertension    Kidney calculus    Right renal mass       Allergies: Patient has no known allergies.  Medications: Prior to Admission medications   Medication Sig Start Date End Date Taking? Authorizing Provider  atenolol (TENORMIN) 25 MG tablet Take 12.5 mg by mouth daily.    [provider]  finasteride (PROSCAR) 5 MG tablet Take 5 mg by mouth daily.    [provider]  sertraline (ZOLOFT) 50 MG tablet Take 50 mg by mouth daily.    [provider]  simvastatin (ZOCOR) 40 MG tablet Take 40 mg by mouth daily.    [provider]     No family history on file.  Social History   Socioeconomic History   Marital status: Married    Spouse name: Not on file   Number of children: Not on file   Years of education: Not on file   Highest education level: Not on  file  Occupational History   Not on file  Social Needs   Financial resource strain: Not on file   Food insecurity    Worry: Not on file    Inability: Not on file   Transportation needs    Medical: Not on file    Non-medical: Not on file  Tobacco Use   Smoking status: Never Smoker   Smokeless tobacco: Never Used  Substance and Sexual Activity   Alcohol use: No   Drug use: No   Sexual activity: Not on file  Lifestyle   Physical activity    Days per week: Not on file    Minutes per session: Not on file   Stress: Not on file  Relationships   Social connections    Talks on phone: Not on file    Gets together: Not on file    Attends religious service: Not on file    Active member of club or organization: Not on file    Attends meetings of clubs or organizations: Not on file    Relationship status: Not on file  Other Topics Concern   Not on file  Social History Narrative   Not on file    Review of Systems  Review of Systems: A 12 point ROS discussed and pertinent positives are indicated in the HPI above.  All other systems are negative.  Physical Exam  No direct physical exam was performed, telephone visit only  Vital Signs: There were no vitals taken for this visit.  Imaging: Ct Abdomen W Wo Contrast  Result Date: 07/24/2019 CLINICAL DATA:  Recurrent upper right renal cell carcinoma status post percutaneous cryoablation on 12/06/2013 and 07/15/2016, presenting for restaging. EXAM: CT ABDOMEN WITHOUT AND WITH CONTRAST TECHNIQUE: Multidetector CT imaging of the abdomen was performed following the standard protocol before and following the bolus administration of intravenous contrast. CONTRAST:  180mL OMNIPAQUE IOHEXOL 300 MG/ML  SOLN COMPARISON:  03/02/2018 CT abdomen. FINDINGS: Lower chest: Perifissural 4 mm anterior left lower lobe nodule (series 10/image 3), not previously imaged. Hepatobiliary: Normal liver with no liver mass. Contracted gallbladder contains  calcified gallstones. No gallbladder wall thickening or pericholecystic fluid. No biliary ductal dilatation. Stable moderate to large periampullary duodenal diverticulum. Pancreas: Normal, with no mass or duct dilation. Spleen: Normal size. No mass. Adrenals/Urinary Tract: Normal adrenals. The ablation defect in the medial upper right kidney measures 2.0 x 1.5 cm and demonstrates a few internal dystrophic calcifications without appreciable enhancement, previously 2.3 x 1.9 cm, slightly decreased. A few scattered nonobstructing right renal stones, largest 4 mm in the lower right kidney. No left renal stones. No hydronephrosis. Simple 1.9 cm anterior interpolar left renal cyst. A few scattered small parapelvic renal cysts in the left kidney. Stomach/Bowel: Normal non-distended stomach. Visualized small and large bowel is normal caliber, with no bowel wall thickening. Marked diffuse colonic diverticulosis. Vascular/Lymphatic: Atherosclerotic nonaneurysmal abdominal aorta. Patent portal, splenic, hepatic and renal veins. No pathologically enlarged lymph nodes in the abdomen. Other: No pneumoperitoneum, ascites or focal fluid collection. Musculoskeletal: No aggressive appearing focal osseous lesions. Moderate thoracic spondylosis. IMPRESSION: 1. No evidence of local tumor recurrence at the ablation site in the upper right kidney. 2. No findings of metastatic disease in the abdomen. 3. Solitary 4 mm left lower lobe solid pulmonary nodule, not previously imaged, almost certainly benign given perifissural location. Suggest attention on follow-up chest CT in 6 months. 4. Chronic findings include: Aortic Atherosclerosis (ICD10-I70.0). Cholelithiasis. Nonobstructing right nephrolithiasis. Marked diffuse colonic diverticulosis. Electronically Signed   By: Ilona Sorrel M.D.   On: 07/24/2019 12:32    Labs:  CBC: No results for input(s): WBC, HGB, HCT, PLT in the last 8760 hours.  COAGS: No results for input(s): INR, APTT  in the last 8760 hours.  BMP: Recent Labs    07/24/19 1106  CREATININE 1.40*    LIVER FUNCTION TESTS: No results for input(s): BILITOT, AST, ALT, ALKPHOS, PROT, ALBUMIN in the last 8760 hours.  TUMOR MARKERS: No results for input(s): AFPTM, CEA, CA199, CHROMGRNA in the last 8760 hours.  Assessment and Plan:  Approximately 3 years status post repeat right renal cryoablation for local recurrence at a previous ablation site.  CT today demonstrates no evidence of residual recurrent tumor.  Ablation site is stable by CT.  No new renal abnormality.  Overall he is doing very well.  Plan: Continue annual surveillance and August 2021 with a repeat CT.  Electronically Signed: Greggory Keen 07/26/2019, 10:22 AM   I spent a total of    25 Minutes in remote  clinical consultation, greater than 50% of which was counseling/coordinating care for this patient status post right renal cryoablation.    Visit type: Audio only (telephone). Audio (no video) only due to patient's lack of internet/smartphone capability. Alternative for in-person consultation at [facility], [place] [address] [place], [place], [place]. This visit type was conducted due to national recommendations for  restrictions regarding the COVID-19 Pandemic (e.g. social distancing).  This format is felt to be most appropriate for this patient at this time.  All issues noted in this document were discussed and addressed.

## 2020-02-26 IMAGING — CT CT ABDOMEN WITHOUT AND WITH CONTRAST
3 of 12 series · 10 of 46 positions shown, 16 images · IV contrast (omnipaque)
Comparison: 03/02/2018 CT abdomen.

CLINICAL DATA: Recurrent upper right renal cell carcinoma status
post percutaneous cryoablation on 12/06/2013 and 07/15/2016,
presenting for restaging.

EXAM:
CT ABDOMEN WITHOUT AND WITH CONTRAST
TECHNIQUE: Multidetector CT imaging of the abdomen was performed following the
standard protocol before and following the bolus administration of
intravenous contrast.
CONTRAST:  100mL OMNIPAQUE IOHEXOL 300 MG/ML  SOLN

[Series 2: axial pre · axial · non-contrast · 0.71mm/px · z∈[-296,-119]mm · 4 of 99 slices shown, 9 images]
[im 20/99  soft-tissue]
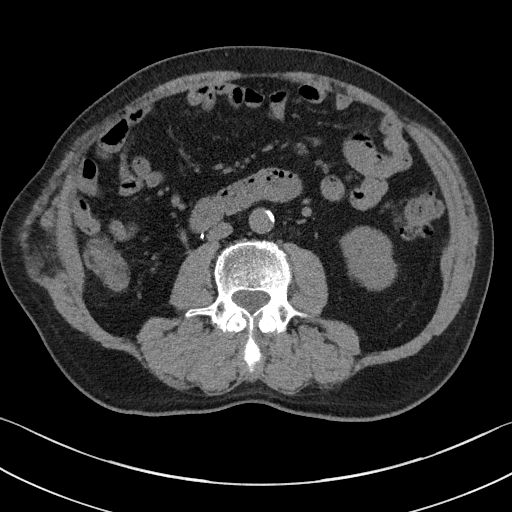
[im 20/99  lung]
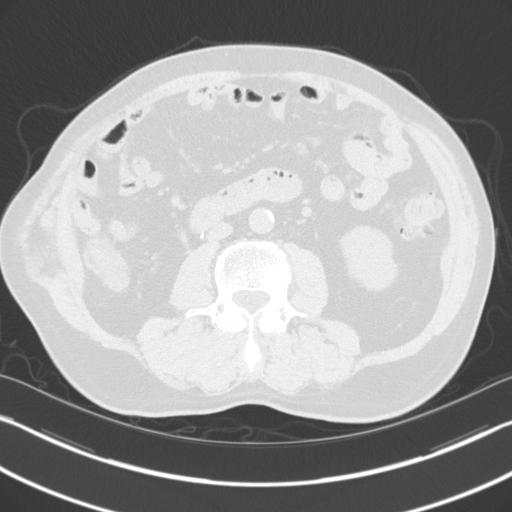
[im 20/99  bone]
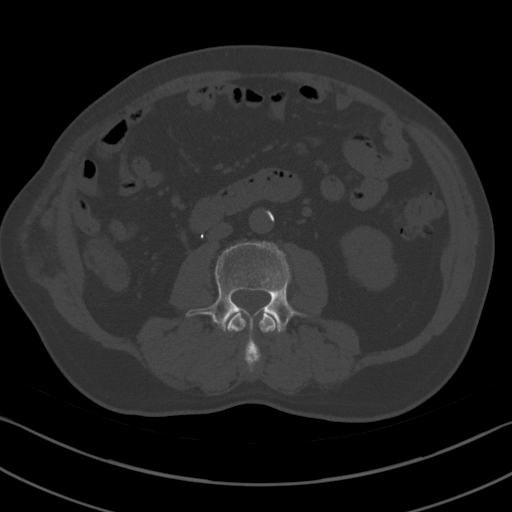
[im 40/99  soft-tissue]
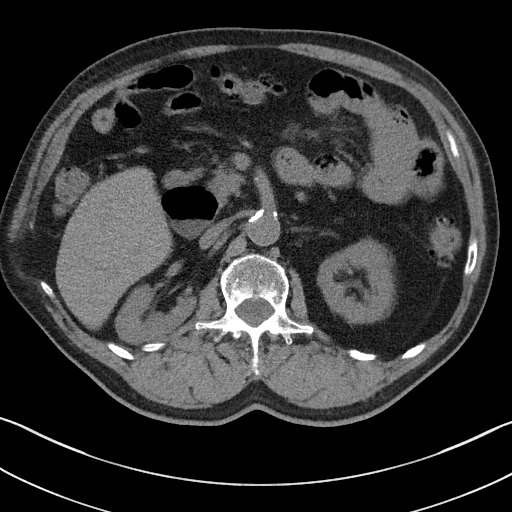
[im 40/99  lung]
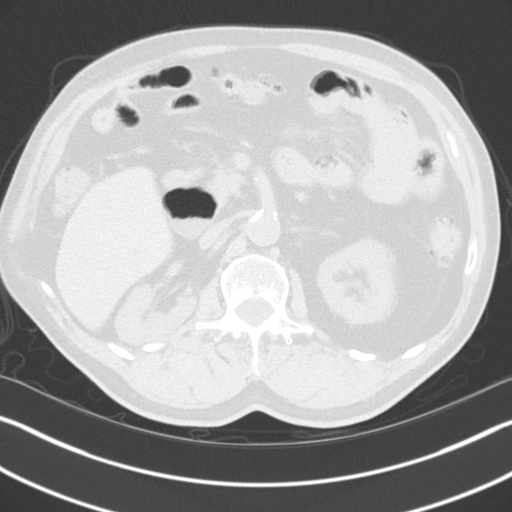
[im 59/99  soft-tissue]
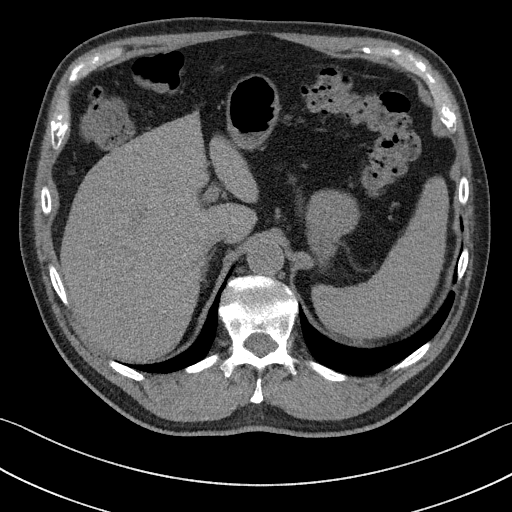
[im 59/99  lung]
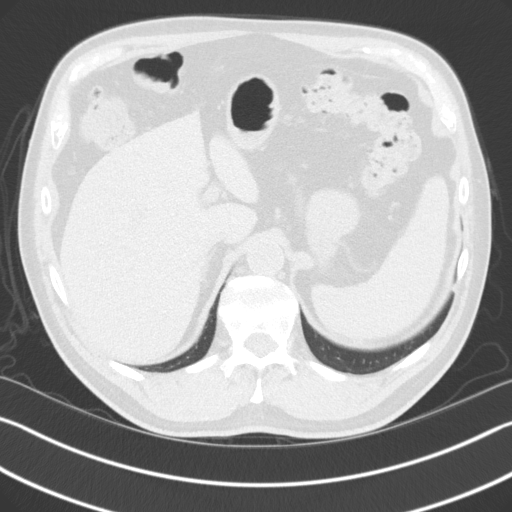
[im 79/99  soft-tissue]
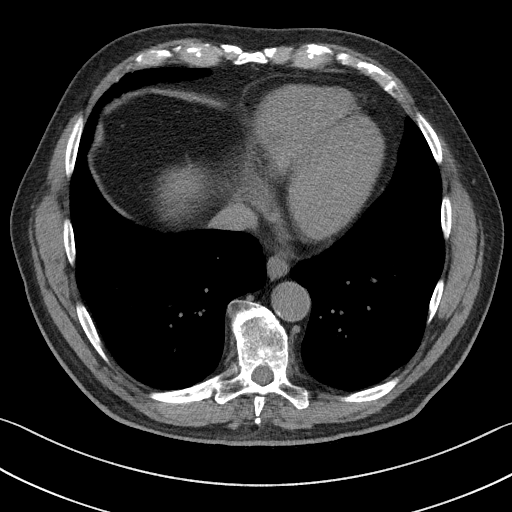
[im 79/99  lung]
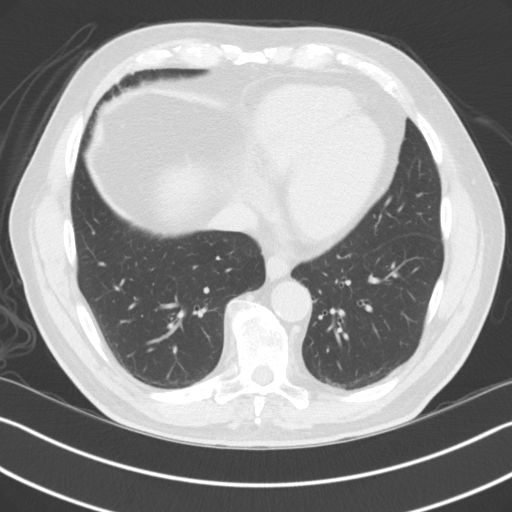

[Series 3: coronal pre · coronal · non-contrast · 0.59mm/px · 2 of 114 slices shown, 3 images]
[im 38/114  soft-tissue]
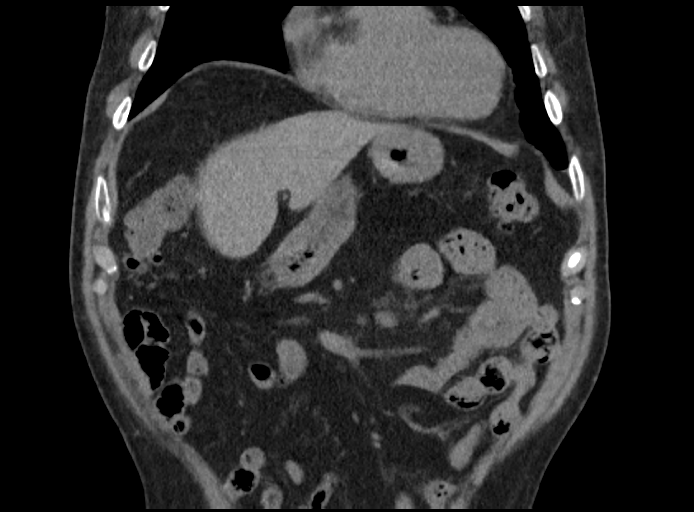
[im 38/114  bone]
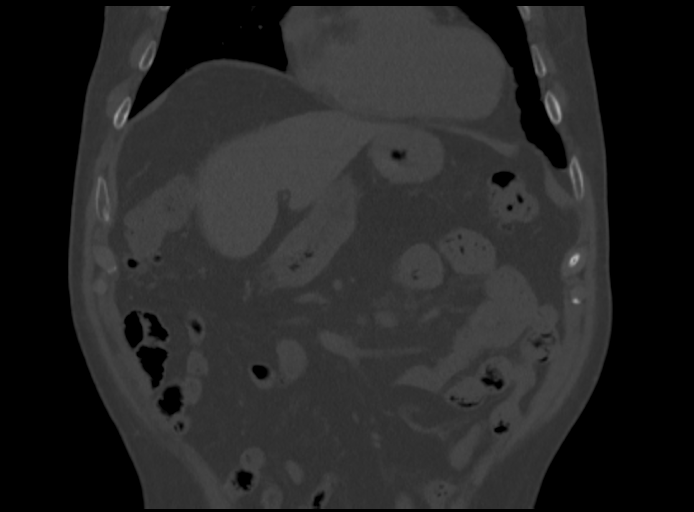
[im 76/114  soft-tissue]
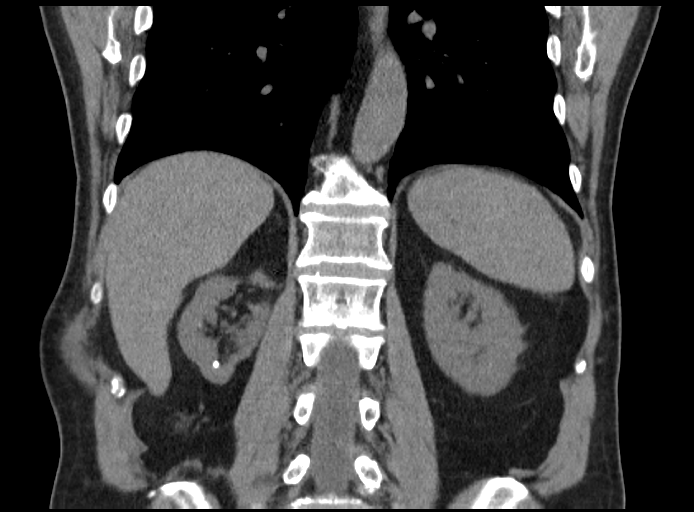

[Series 6: axial arterial · axial · arterial · 0.71mm/px · z∈[-298,-121]mm · 4 of 99 slices shown]
[im 20/99  soft-tissue]
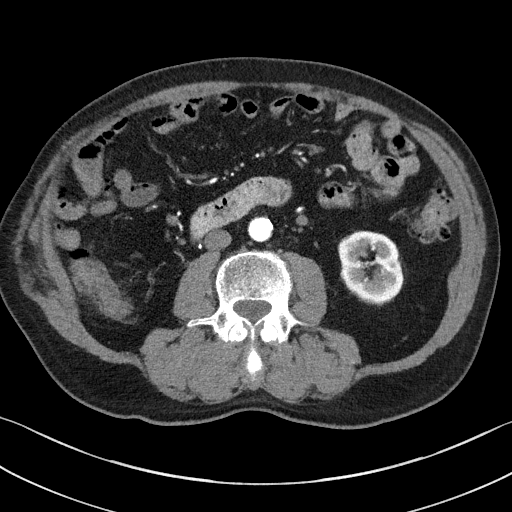
[im 40/99  soft-tissue]
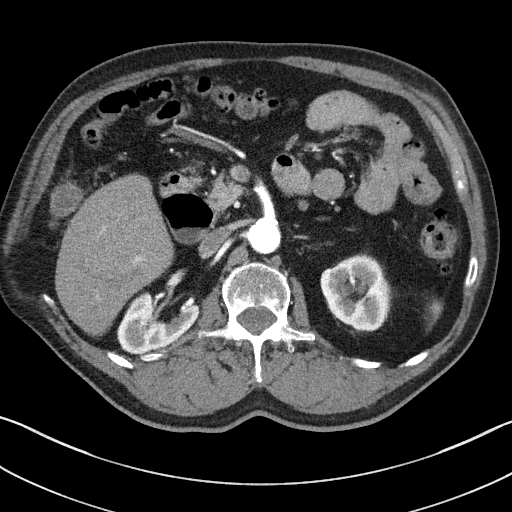
[im 59/99  soft-tissue]
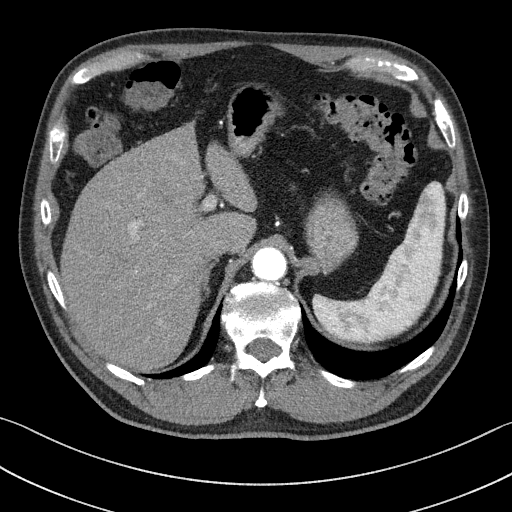
[im 79/99  soft-tissue]
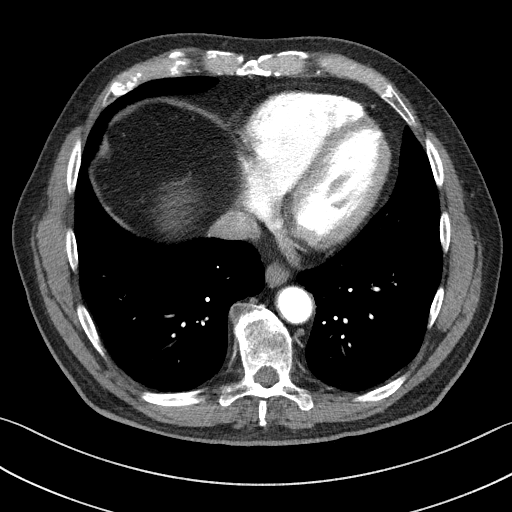

[10 of 46 positions shown; findings below may reference images not displayed]

FINDINGS: Lower chest: Perifissural 4 mm anterior left lower lobe nodule
(series 10/image 3), not previously imaged.

Hepatobiliary: Normal liver with no liver mass. Contracted
gallbladder contains calcified gallstones. No gallbladder wall
thickening or pericholecystic fluid. No biliary ductal dilatation.
Stable moderate to large periampullary duodenal diverticulum.

Pancreas: Normal, with no mass or duct dilation.

Spleen: Normal size. No mass.

Adrenals/Urinary Tract: Normal adrenals. The ablation defect in the
medial upper right kidney measures 2.0 x 1.5 cm and demonstrates a
few internal dystrophic calcifications without appreciable
enhancement, previously 2.3 x 1.9 cm, slightly decreased. A few
scattered nonobstructing right renal stones, largest 4 mm in the
lower right kidney. No left renal stones. No hydronephrosis. Simple
1.9 cm anterior interpolar left renal cyst. A few scattered small
parapelvic renal cysts in the left kidney.

Stomach/Bowel: Normal non-distended stomach. Visualized small and
large bowel is normal caliber, with no bowel wall thickening. Marked
diffuse colonic diverticulosis.

Vascular/Lymphatic: Atherosclerotic nonaneurysmal abdominal aorta.
Patent portal, splenic, hepatic and renal veins. No pathologically
enlarged lymph nodes in the abdomen.

Other: No pneumoperitoneum, ascites or focal fluid collection.

Musculoskeletal: No aggressive appearing focal osseous lesions.
Moderate thoracic spondylosis.
IMPRESSION: 1. No evidence of local tumor recurrence at the ablation site in the
upper right kidney.
2. No findings of metastatic disease in the abdomen.
3. Solitary 4 mm left lower lobe solid pulmonary nodule, not
previously imaged, almost certainly benign given perifissural
location. Suggest attention on follow-up chest CT in 6 months.
4. Chronic findings include: Aortic Atherosclerosis ([id number]).
Cholelithiasis. Nonobstructing right nephrolithiasis. Marked diffuse
colonic diverticulosis.

## 2020-07-15 ENCOUNTER — Other Ambulatory Visit: Payer: Self-pay | Admitting: Interventional Radiology

## 2020-07-15 DIAGNOSIS — N2889 Other specified disorders of kidney and ureter: Secondary | ICD-10-CM
